# Patient Record
Sex: Female | Born: 1985 | Race: White | Hispanic: No | Marital: Married | Smoking: Never smoker
Health system: Southern US, Community
[De-identification: ages and names within clinical notes are randomized; demographics above are authoritative.]

## PROBLEM LIST (undated history)

## (undated) DIAGNOSIS — M329 Systemic lupus erythematosus, unspecified: Secondary | ICD-10-CM

## (undated) DIAGNOSIS — I82409 Acute embolism and thrombosis of unspecified deep veins of unspecified lower extremity: Secondary | ICD-10-CM

## (undated) DIAGNOSIS — D689 Coagulation defect, unspecified: Secondary | ICD-10-CM

## (undated) HISTORY — PX: TONSILLECTOMY: SUR1361

---

## 2010-03-19 DIAGNOSIS — I82409 Acute embolism and thrombosis of unspecified deep veins of unspecified lower extremity: Secondary | ICD-10-CM | POA: Insufficient documentation

## 2015-05-02 DIAGNOSIS — D6861 Antiphospholipid syndrome: Secondary | ICD-10-CM | POA: Insufficient documentation

## 2015-05-02 DIAGNOSIS — Z86718 Personal history of other venous thrombosis and embolism: Secondary | ICD-10-CM | POA: Insufficient documentation

## 2015-05-02 DIAGNOSIS — F32A Depression, unspecified: Secondary | ICD-10-CM | POA: Insufficient documentation

## 2015-05-02 DIAGNOSIS — O99119 Other diseases of the blood and blood-forming organs and certain disorders involving the immune mechanism complicating pregnancy, unspecified trimester: Secondary | ICD-10-CM | POA: Insufficient documentation

## 2015-05-02 DIAGNOSIS — O99210 Obesity complicating pregnancy, unspecified trimester: Secondary | ICD-10-CM | POA: Insufficient documentation

## 2015-05-02 DIAGNOSIS — F329 Major depressive disorder, single episode, unspecified: Secondary | ICD-10-CM | POA: Insufficient documentation

## 2015-05-30 DIAGNOSIS — O99891 Other specified diseases and conditions complicating pregnancy: Secondary | ICD-10-CM | POA: Insufficient documentation

## 2015-05-30 DIAGNOSIS — R8271 Bacteriuria: Secondary | ICD-10-CM | POA: Insufficient documentation

## 2015-05-31 DIAGNOSIS — K219 Gastro-esophageal reflux disease without esophagitis: Secondary | ICD-10-CM | POA: Insufficient documentation

## 2015-05-31 DIAGNOSIS — Z8639 Personal history of other endocrine, nutritional and metabolic disease: Secondary | ICD-10-CM | POA: Insufficient documentation

## 2015-08-23 DIAGNOSIS — R200 Anesthesia of skin: Secondary | ICD-10-CM | POA: Insufficient documentation

## 2015-10-20 DIAGNOSIS — O3663X0 Maternal care for excessive fetal growth, third trimester, not applicable or unspecified: Secondary | ICD-10-CM | POA: Insufficient documentation

## 2015-10-20 DIAGNOSIS — O409XX0 Polyhydramnios, unspecified trimester, not applicable or unspecified: Secondary | ICD-10-CM | POA: Insufficient documentation

## 2015-11-18 DIAGNOSIS — Z3009 Encounter for other general counseling and advice on contraception: Secondary | ICD-10-CM | POA: Insufficient documentation

## 2016-01-19 DIAGNOSIS — G5603 Carpal tunnel syndrome, bilateral upper limbs: Secondary | ICD-10-CM | POA: Insufficient documentation

## 2016-03-29 DIAGNOSIS — E669 Obesity, unspecified: Secondary | ICD-10-CM | POA: Insufficient documentation

## 2017-08-16 DIAGNOSIS — E0590 Thyrotoxicosis, unspecified without thyrotoxic crisis or storm: Secondary | ICD-10-CM | POA: Insufficient documentation

## 2017-08-16 DIAGNOSIS — E042 Nontoxic multinodular goiter: Secondary | ICD-10-CM | POA: Insufficient documentation

## 2018-01-22 ENCOUNTER — Emergency Department: Payer: Managed Care, Other (non HMO)

## 2018-01-22 ENCOUNTER — Encounter: Payer: Self-pay | Admitting: Emergency Medicine

## 2018-01-22 ENCOUNTER — Emergency Department
Admission: EM | Admit: 2018-01-22 | Discharge: 2018-01-22 | Disposition: A | Discharge status: Home / Self Care | Payer: Managed Care, Other (non HMO) | Attending: Emergency Medicine | Admitting: Emergency Medicine

## 2018-01-22 ENCOUNTER — Other Ambulatory Visit: Payer: Self-pay

## 2018-01-22 DIAGNOSIS — O468X1 Other antepartum hemorrhage, first trimester: Secondary | ICD-10-CM

## 2018-01-22 DIAGNOSIS — O208 Other hemorrhage in early pregnancy: Secondary | ICD-10-CM | POA: Diagnosis present

## 2018-01-22 DIAGNOSIS — O209 Hemorrhage in early pregnancy, unspecified: Secondary | ICD-10-CM

## 2018-01-22 DIAGNOSIS — Z3A12 12 weeks gestation of pregnancy: Secondary | ICD-10-CM | POA: Diagnosis not present

## 2018-01-22 DIAGNOSIS — O418X10 Other specified disorders of amniotic fluid and membranes, first trimester, not applicable or unspecified: Secondary | ICD-10-CM

## 2018-01-22 HISTORY — DX: Systemic lupus erythematosus, unspecified: M32.9

## 2018-01-22 HISTORY — DX: Coagulation defect, unspecified: D68.9

## 2018-01-22 LAB — COMPREHENSIVE METABOLIC PANEL
ALK PHOS: 39 U/L (ref 38–126)
ALT: 12 U/L (ref 0–44)
ANION GAP: 6 (ref 5–15)
AST: 15 U/L (ref 15–41)
Albumin: 3.3 g/dL — ABNORMAL LOW (ref 3.5–5.0)
BUN: 9 mg/dL (ref 6–20)
CALCIUM: 8.6 mg/dL — AB (ref 8.9–10.3)
CO2: 24 mmol/L (ref 22–32)
CREATININE: 0.58 mg/dL (ref 0.44–1.00)
Chloride: 107 mmol/L (ref 98–111)
GFR calc Af Amer: >60 mL/min (ref >60)
Glucose, Bld: 103 mg/dL — ABNORMAL HIGH (ref 70–99)
Potassium: 3.9 mmol/L (ref 3.5–5.1)
Sodium: 137 mmol/L (ref 135–145)
TOTAL PROTEIN: 7.1 g/dL (ref 6.5–8.1)
Total Bilirubin: 0.3 mg/dL (ref 0.3–1.2)

## 2018-01-22 LAB — CBC
HEMATOCRIT: 38.6 % (ref 36.0–46.0)
Hemoglobin: 12.7 g/dL (ref 12.0–15.0)
MCH: 28 pg (ref 26.0–34.0)
MCHC: 32.9 g/dL (ref 30.0–36.0)
MCV: 85.2 fL (ref 80.0–100.0)
NRBC: 0 % (ref 0.0–0.2)
PLATELETS: 220 10*3/uL (ref 150–400)
RBC: 4.53 MIL/uL (ref 3.87–5.11)
RDW: 13.2 % (ref 11.5–15.5)
WBC: 7.7 10*3/uL (ref 4.0–10.5)

## 2018-01-22 LAB — POCT PREGNANCY, URINE: PREG TEST UR: POSITIVE — AB

## 2018-01-22 LAB — HCG, QUANTITATIVE, PREGNANCY: hCG, Beta Chain, Quant, S: 106816 m[IU]/mL — ABNORMAL HIGH (ref <5)

## 2018-01-22 LAB — ABO/RH: ABO/RH(D): O POS

## 2018-01-22 NOTE — ED Notes (Signed)
Pt went to ultrasound.

## 2018-01-22 NOTE — ED Triage Notes (Signed)
Pt arrived to the ED accompanied by her coworker for complaints of vaginal bleeding. Pt reports that she is pregnant. Moderate bleeding noted. Dr. Manson Passey is at bedside.

## 2018-01-22 NOTE — ED Notes (Signed)
Pt returned from ultrasound

## 2018-01-22 NOTE — ED Notes (Signed)
Patient with new bright red blood upon standing up to change. Dr. Manson Passey at bedside for reevaluation. Per MD, patient ok to be discharged and follow up with OB.

## 2018-01-22 NOTE — ED Provider Notes (Signed)
[facility] Emergency Department Provider Note _______________________   First MD Initiated Contact with Patient 01/22/18 [phone]     (approximate)  I have reviewed the triage vital signs and the nursing notes.   HISTORY  Chief Complaint Vaginal Bleeding and Threatened Miscarriage   HPI Nicole Black is a 32 y.o. female G3, P2 currently [redacted] weeks pregnant with below list of chronic medical conditions presents to the emergency department with acute onset of vaginal bleeding this morning while at work.  Patient states that she noted light pink blood on her thighs and when she urinated.  Patient denies any abdominal pelvic discomfort.  Patient states that her blood type is O+   Past Medical History:  Diagnosis Date  . Blood clotting tendency (HCC)   . Lupus (HCC)     There are no active problems to display for this patient.   Past Surgical History:  Procedure Laterality Date  . TONSILLECTOMY      Prior to Admission medications   Not on File    Allergies No known drug allergies History reviewed. No pertinent family history.  Social History Social History   Tobacco Use  . Smoking status: Never Smoker  . Smokeless tobacco: Never Used  Substance Use Topics  . Alcohol use: Not Currently  . Drug use: Never    Review of Systems Constitutional: No fever/chills Eyes: No visual changes. ENT: No sore throat. Cardiovascular: Denies chest pain. Respiratory: Denies shortness of breath. Gastrointestinal: No abdominal pain.  No nausea, no vomiting.  No diarrhea.  No constipation. Genitourinary: Negative for dysuria.  Positive for vaginal bleeding musculoskeletal: Negative for neck pain.  Negative for back pain. Integumentary: Negative for rash. Neurological: Negative for headaches, focal weakness or numbness.  ____________________________________________   PHYSICAL EXAM:  VITAL SIGNS: ED Triage Vitals  Enc Vitals Group     BP 01/22/18 0420  127/65     Pulse Rate 01/22/18 0420 89     Resp 01/22/18 0420 18     Temp 01/22/18 0420 98 F (36.7 C)     Temp Source 01/22/18 0420 Oral     SpO2 01/22/18 0420 100 %     Weight 01/22/18 0421 132.8 kg (292 lb 12.3 oz)     Height 01/22/18 0421 1.803 m (5\' 11" )     Head Circumference --      Peak Flow --      Pain Score 01/22/18 0420 0     Pain Loc --      Pain Edu? --      Excl. in GC? --     Constitutional: Alert and oriented. Well appearing and in no acute distress. Eyes: Conjunctivae are normal.  Head: Atraumatic. Mouth/Throat: Mucous membranes are moist.  Oropharynx non-erythematous. Neck: No stridor.   Cardiovascular: Normal rate, regular rhythm. Good peripheral circulation. Grossly normal heart sounds. Respiratory: Normal respiratory effort.  No retractions. Lungs CTAB. Gastrointestinal: Soft and nontender. No distention.  Genitourinary: No active vaginal bleeding cervix closed Musculoskeletal: No lower extremity tenderness nor edema. No gross deformities of extremities. Neurologic:  Normal speech and language. No gross focal neurologic deficits are appreciated.  Skin:  Skin is warm, dry and intact. No rash noted. Psychiatric: Mood and affect are normal. Speech and behavior are normal.  ____________________________________________   LABS (all labs ordered are listed, but only abnormal results are displayed)  Labs Reviewed  POCT PREGNANCY, URINE - Abnormal; Notable for the following components:      Result Value  Preg Test, Ur POSITIVE (*)    All other components within normal limits  CBC  COMPREHENSIVE METABOLIC PANEL  HCG, QUANTITATIVE, PREGNANCY  ABO/RH   __________________________________________  RADIOLOGY I, Howells N Alwilda Gilland, personally viewed and evaluated these images (plain radiographs) as part of my medical decision making, as well as reviewing the written report by the radiologist.  ED MD interpretation: Single live intrauterine pregnancy 12 weeks 4  days with heart rate of 150 bpm small anterior subchorionic hemorrhage present radiologist.  Official radiology report(s): US Ob Comp Less 14 Wks  Result Date: 01/22/2018 CLINICAL DATA:  Vaginal bleeding for 2 hours. Positive urine pregnancy test. Estimated gestational age by LMP is 14 weeks 4 days. EXAM: OBSTETRIC <14 WK ULTRASOUND TECHNIQUE: Transabdominal ultrasound was performed for evaluation of the gestation as well as the maternal uterus and adnexal regions. COMPARISON:  None. FINDINGS: Intrauterine gestational sac: A single intrauterine pregnancy is present. Yolk sac: Yolk sac is not identified consistent with gestational age. Embryo:  Fetal pole is present. Cardiac Activity: Fetal cardiac activity is observed. Heart Rate: 150 bpm CRL: 60.9 mm   12 w 4 d                  [place] EDC: 08/02/2018 Subchorionic hemorrhage: Small anterior subchorionic hemorrhage. Measures 1.8 x 0.7 x 4.2 cm. Maternal uterus/adnexae: Uterus is anteverted. No myometrial mass lesions identified. Both ovaries are visualized and appear normal. No abnormal pelvic fluid. IMPRESSION: Single intrauterine pregnancy. Estimated gestational age by crown-rump length is 12 weeks 4 days. Small subchorionic hemorrhage is present. Electronically Signed   By: Burman Nieves M.D.   On: 01/22/2018 06:10      Procedures   ____________________________________________   INITIAL IMPRESSION / ASSESSMENT AND PLAN / ED COURSE  As part of my medical decision making, I reviewed the following data within the electronic MEDICAL RECORD NUMBER   32 year old female presented with above-stated history and physical exam of acute onset of vaginal bleeding during pregnancy.  Ultrasound revealed evidence of a small subchorionic hemorrhage.  No active vaginal bleeding on pelvic exam.  Patient has an appointment today with OB/GYN high risk at [facility].  I encouraged the patient to keep that appointment.  I advised the patient to remain on pelvic  rest. ____________________________________________  FINAL CLINICAL IMPRESSION(S) / ED DIAGNOSES  Final diagnoses:  Subchorionic hemorrhage of placenta in first trimester, single or unspecified fetus  Vaginal bleeding in pregnancy, first trimester     MEDICATIONS GIVEN DURING THIS VISIT:  Medications - No data to display   ED Discharge Orders    None       Note:  This document was prepared using Dragon voice recognition software and may include unintentional dictation errors.    Darci Current, MD 01/22/18 [phone]

## 2018-10-22 ENCOUNTER — Ambulatory Visit: Payer: Managed Care, Other (non HMO) | Admitting: Adult Health

## 2018-10-22 ENCOUNTER — Other Ambulatory Visit: Payer: Self-pay

## 2018-10-22 ENCOUNTER — Encounter: Payer: Self-pay | Admitting: Adult Health

## 2018-10-22 DIAGNOSIS — R6889 Other general symptoms and signs: Secondary | ICD-10-CM

## 2018-10-22 DIAGNOSIS — R0981 Nasal congestion: Secondary | ICD-10-CM

## 2018-10-22 DIAGNOSIS — Z20822 Contact with and (suspected) exposure to covid-19: Secondary | ICD-10-CM

## 2018-10-22 DIAGNOSIS — Z86718 Personal history of other venous thrombosis and embolism: Secondary | ICD-10-CM | POA: Insufficient documentation

## 2018-10-22 DIAGNOSIS — J3489 Other specified disorders of nose and nasal sinuses: Secondary | ICD-10-CM

## 2018-10-22 DIAGNOSIS — Z20828 Contact with and (suspected) exposure to other viral communicable diseases: Secondary | ICD-10-CM

## 2018-10-22 DIAGNOSIS — IMO0002 Reserved for concepts with insufficient information to code with codable children: Secondary | ICD-10-CM | POA: Insufficient documentation

## 2018-10-22 DIAGNOSIS — Z8759 Personal history of other complications of pregnancy, childbirth and the puerperium: Secondary | ICD-10-CM | POA: Insufficient documentation

## 2018-10-22 DIAGNOSIS — M329 Systemic lupus erythematosus, unspecified: Secondary | ICD-10-CM | POA: Insufficient documentation

## 2018-10-22 MED ORDER — SALINE SPRAY 0.65 % NA SOLN: 1.0000 | NASAL | 0 refills | Status: AC | PRN

## 2018-10-22 MED ORDER — AMOXICILLIN 875 MG PO TABS: 875.0000 mg | ORAL_TABLET | Freq: Two times a day (BID) | ORAL | 0 refills | Status: AC

## 2018-10-22 MED ORDER — FLUTICASONE PROPIONATE 50 MCG/ACT NA SUSP: 2.0000 | Freq: Every day | NASAL | 0 refills | Status: AC

## 2018-10-22 NOTE — Patient Instructions (Signed)
Follow up with primary care if nasal congestion worsens or does not clear 100 % with treatment.  Hello Nicole Black,  You are being placed in the home monitoring program for COVID-19 (commonly known as Coronavirus).  This is because you are suspected to have the virus or are known to have the virus.  If you are unsure which group you fall into call your clinic.    As part of this program, you'll answer a daily questionnaire in the MyChart mobile app. You'll receive a notification through the MyChart app when the questionnaire is available. When you log in to MyChart, you'll see the tasks in your To Do activity.       Clinicians will see any answers that are concerning and take appropriate steps.  If at any point you are having a medical emergency, call 911.  If otherwise concerned call your clinic instead of coming into the clinic or hospital.  To keep from spreading the disease you should: Stay home and limit contact with other people as much as possible.  Wash your hands frequently. Cover your coughs and sneezes with a tissue, and throw used tissues in the trash.   Clean and disinfect frequently touched surfaces and objects.    Take care of yourself by: Staying home Resting Drinking fluids Take fever-reducing medications (Tylenol/Acetaminophen and Ibuprofen)  For more information on the disease go to the Centers for Disease Control and Prevention website   COVID-19: How to Protect Yourself and Others Know how it spreads  There is currently no vaccine to prevent coronavirus disease 2019 (COVID-19).  The best way to prevent illness is to avoid being exposed to this virus.  The virus is thought to spread mainly from person-to-person. ? Between people who are in close contact with one another (within about 6 feet). ? Through respiratory droplets produced when an infected person coughs, sneezes or talks. ? These droplets can land in the mouths or noses of people who are nearby or possibly  be inhaled into the lungs. ? Some recent studies have suggested that COVID-19 may be spread by people who are not showing symptoms. Everyone should Clean your hands often  Wash your hands often with soap and water for at least 20 seconds especially after you have been in a public place, or after blowing your nose, coughing, or sneezing.  If soap and water are not readily available, use a hand sanitizer that contains at least 60% alcohol. Cover all surfaces of your hands and rub them together until they feel dry.  Avoid touching your eyes, nose, and mouth with unwashed hands. Avoid close contact  Stay home if you are sick.  Avoid close contact with people who are sick.  Put distance between yourself and other people. ? Remember that some people without symptoms may be able to spread virus. ? This is especially important for people who are at higher risk of getting very [employer].[place].www.cdc.gov/coronavirus/2019-ncov/need-extra-precautions/people-at-higher-risk.html Cover your mouth and nose with a cloth face cover when around others  You could spread COVID-19 to others even if you do not feel sick.  Everyone should wear a cloth face cover when they have to go out in public, for example to the grocery store or to pick up other necessities. ? Cloth face coverings should not be placed on young children under age 10, anyone who has trouble breathing, or is unconscious, incapacitated or otherwise unable to remove the mask without assistance.  The cloth face cover is meant to protect other people  in case you are infected.  Do NOT use a facemask meant for a Research scientist (physical sciences)healthcare worker.  Continue to keep about 6 feet between yourself and others. The cloth face cover is not a substitute for social distancing. Cover coughs and sneezes  If you are in a private setting and do not have on your cloth face covering, remember to always cover your mouth and nose with a tissue when you cough or sneeze or use the inside of  your elbow.  Throw used tissues in the trash.  Immediately wash your hands with soap and water for at least 20 seconds. If soap and water are not readily available, clean your hands with a hand sanitizer that contains at least 60% alcohol. Clean and disinfect  Clean AND disinfect frequently touched surfaces daily. This includes tables, doorknobs, light switches, countertops, handles, desks, phones, keyboards, toilets, faucets, and sinks. ktimeonline.comwww.cdc.gov/coronavirus/2019-ncov/prevent-getting-sick/disinfecting-your-home.html  If surfaces are dirty, clean them: Use detergent or soap and water prior to disinfection.  Then, use a household disinfectant. You can see a list of EPA-registered household disinfectants here. [place].co.ukcdc.gov/coronavirus 07/22/2018 This information is not intended to replace advice given to you by your health care provider. Make sure you discuss any questions you have with your health care provider. Document Released: 07/01/2018 Document Revised: 07/30/2018 Document Reviewed: 07/01/2018 Elsevier Patient Education  2020 Elsevier Inc. Upper Respiratory Infection, Adult An upper respiratory infection (URI) affects the nose, throat, and upper air passages. URIs are caused by germs (viruses). The most common type of URI is often called "the common cold." Medicines cannot cure URIs, but you can do things at home to relieve your symptoms. URIs usually get better within 7-10 days. Follow these instructions at home: Activity  Rest as needed.  If you have a fever, stay home from work or school until your fever is gone, or until your doctor says you may return to work or school. ? You should stay home until you cannot spread the infection anymore (you are not contagious). ? Your doctor may have you wear a face mask so you have less risk of spreading the infection. Relieving symptoms  Gargle with a salt-water mixture 3-4 times a day or as needed. To make a salt-water mixture, completely  dissolve -1 tsp of salt in 1 cup of warm water.  Use a cool-mist humidifier to add moisture to the air. This can help you breathe more easily. Eating and drinking   Drink enough fluid to keep your pee (urine) pale yellow.  Eat soups and other clear broths. General instructions   Take over-the-counter and prescription medicines only as told by your doctor. These include cold medicines, fever reducers, and cough suppressants.  Do not use any products that contain nicotine or tobacco. These include cigarettes and e-cigarettes. If you need help quitting, ask your doctor.  Avoid being where people are smoking (avoid secondhand smoke).  Make sure you get regular shots and get the flu shot every year.  Keep all follow-up visits as told by your doctor. This is important. How to avoid spreading infection to others   Wash your hands often with soap and water. If you do not have soap and water, use hand sanitizer.  Avoid touching your mouth, face, eyes, or nose.  Cough or sneeze into a tissue or your sleeve or elbow. Do not cough or sneeze into your hand or into the air. Contact a doctor if:  You are getting worse, not better.  You have any of these: ? A  fever. ? Chills. ? Brown or red mucus in your nose. ? Yellow or brown fluid (discharge)coming from your nose. ? Pain in your face, especially when you bend forward. ? Swollen neck glands. ? Pain with swallowing. ? White areas in the back of your throat. Get help right away if:  You have shortness of breath that gets worse.  You have very bad or constant: ? Headache. ? Ear pain. ? Pain in your forehead, behind your eyes, and over your cheekbones (sinus pain). ? Chest pain.  You have long-lasting (chronic) lung disease along with any of these: ? Wheezing. ? Long-lasting cough. ? Coughing up blood. ? A change in your usual mucus.  You have a stiff neck.  You have changes in  your: ? Vision. ? Hearing. ? Thinking. ? Mood. Summary  An upper respiratory infection (URI) is caused by a germ called a virus. The most common type of URI is often called "the common cold."  URIs usually get better within 7-10 days.  Take over-the-counter and prescription medicines only as told by your doctor. This information is not intended to replace advice given to you by your health care provider. Make sure you discuss any questions you have with your health care provider. Document Released: 08/22/2007 Document Revised: 03/13/2018 Document Reviewed: 10/26/2016 Elsevier Patient Education  2020 [employer].

## 2018-10-22 NOTE — Progress Notes (Addendum)
Patient ID: Nicole Black, female   DOB: 02/26/1986, 33 y.o.   MRN: [id number] [facility]   Virtual Visit via Telephone Note  I connected with Nicole MazeAshlyn Swartz on 10/22/18 at  2:00 PM EDT by telephone and verified that I am speaking with the correct person using two identifiers.  Location: Patient: at home  Provider: [facility], [place] [facility], [place]     I discussed the limitations, risks, security and privacy concerns of performing an evaluation and management service by telephone and the availability of in person appointments. I also discussed with the patient that there may be a patient responsible charge related to this service. The patient expressed understanding and agreed to proceed.  History of Present Illness: Patient is a 33 year old female in no acute distress who calls for a telephone visit due to Covid 19 pandemic. Onset of symptoms 10/17/18.  She was exposed to Covid on July 22nd and August 3rd while at work with [employer] [place] [employer]. Both exposures were close contact. She is coughing occasionally only with facial mask.  She denies any other coughing. She has nasal drainage that was clear and now has turned yellow.   History of DVT with pregnancy 2019, and has history of lupus. She is on Lasix, and ASA 81 mg. Denies any edema.   Xyzal she takes as needed. She has taken benadryl at night.  No nasal sprays. She has used Afrin x 3 days.  Nasal pressure and frontal pressure.    Patient  denies any fever, body aches,chills, rash, chest pain, shortness of breath, nausea, vomiting, or diarrhea.   She has young children. Her children are home. She recently had sick child with same type symptoms.   Patient's last menstrual period was 10/15/2018 (approximate). Not lactating.   No Known Allergies  She has no fever she reports and has " felt no need to take her pulse oximeter " she states. She has primary  care provider she sees regularly she reports.  Observations/Objective: 98.4 oral temperature.    Patient is alert and oriented and responsive to questions Engages in conversation with provider. Speaks in full sentences without any pauses without any shortness of breath or distress.   Speaks 20 plus words without pause while on phone with provider.   Assessment and Plan:  1. Nasal sinus congestion   2. Sinus pressure   3. Exposure to Covid-19 Virus   4. Suspected Covid-19 Virus Infection     Follow Up Instructions: Meds ordered this encounter  Medications  . amoxicillin (AMOXIL) 875 MG tablet    Sig: Take 1 tablet (875 mg total) by mouth 2 (two) times daily.    Dispense:  20 tablet    Refill:  0  . fluticasone (FLONASE) 50 MCG/ACT nasal spray    Sig: Place 2 sprays into both nostrils daily.    Dispense:  16 g    Refill:  0  . sodium chloride (OCEAN) 0.65 % SOLN nasal spray    Sig: Place 1 spray into both nostrils as needed for congestion.    Dispense:  37.5 mL    Refill:  0  Start your Xyzal daily.  Discontinue Afrin. Use ocean nasal spray for moisture.  She has already gone for Covid testing today 10/22/18. Quarantine until August 12 th Wednesday per health care provider guidelines CDC must meet requirements on note sent to patient in [facility]MY CHART.   Advised recommend Augmentin for sinus  infection-patient reports it gives her diarrhea she prefers amoxicillin.  Discussed will give however with her history of sinusitis in past - bacteria may be resistant to Amoxicillin and to call back should any symptoms worsen.  I discussed the assessment and treatment plan with the patient. The patient was provided an opportunity to ask questions and all were answered. The patient agreed with the plan and demonstrated an understanding of the instructions.   The patient was advised to call back or seek an in-person evaluation if the symptoms worsen or if the condition fails to improve as  anticipated.  I provided 15 minutes of non-face-to-face time during this encounter.   Marcille Buffy, FNP

## 2018-10-23 LAB — NOVEL CORONAVIRUS, NAA: SARS-CoV-2, NAA: NOT DETECTED

## 2018-10-27 ENCOUNTER — Telehealth: Payer: Self-pay

## 2018-10-27 NOTE — Telephone Encounter (Signed)
The patient called and staated that she feels that her congestion has gone into her chest now since her visit. She was advised after conferring with the Provider Laverna Peace FNP-C  that with her symptoms changing that she would need an in person evaluation, that another virtual consult may not be appropriate care and was advised to got [facility] or the ED. The patient stated that she really did not want to goto the ED due to receiving a large bill but would go as a last resort and also had issues with child care at the moment but gave verbal understanding of our recommendations.

## 2018-10-28 NOTE — Telephone Encounter (Signed)
10/27/18 She was advised emergency room or [facility] and that second virtual visit was not appropriate. Provider was in room with [place] while patient was on the phone.

## 2018-12-07 ENCOUNTER — Other Ambulatory Visit: Payer: Self-pay

## 2018-12-07 ENCOUNTER — Emergency Department: Payer: Managed Care, Other (non HMO)

## 2018-12-07 ENCOUNTER — Emergency Department
Admission: EM | Admit: 2018-12-07 | Discharge: 2018-12-07 | Disposition: A | Discharge status: Home / Self Care | Payer: Managed Care, Other (non HMO) | Attending: Emergency Medicine | Admitting: Emergency Medicine

## 2018-12-07 ENCOUNTER — Encounter: Payer: Self-pay | Admitting: Emergency Medicine

## 2018-12-07 DIAGNOSIS — Z79899 Other long term (current) drug therapy: Secondary | ICD-10-CM | POA: Diagnosis not present

## 2018-12-07 DIAGNOSIS — R2242 Localized swelling, mass and lump, left lower limb: Secondary | ICD-10-CM | POA: Diagnosis present

## 2018-12-07 DIAGNOSIS — M79662 Pain in left lower leg: Secondary | ICD-10-CM | POA: Diagnosis not present

## 2018-12-07 DIAGNOSIS — Z7982 Long term (current) use of aspirin: Secondary | ICD-10-CM | POA: Diagnosis not present

## 2018-12-07 HISTORY — DX: Acute embolism and thrombosis of unspecified deep veins of unspecified lower extremity: I82.409

## 2018-12-07 MED ORDER — ENOXAPARIN SODIUM 100 MG/ML ~~LOC~~ SOLN: 200.0000 mg | Freq: Every day | SUBCUTANEOUS | 0 refills | Status: AC

## 2018-12-07 MED ORDER — ENOXAPARIN SODIUM 100 MG/ML ~~LOC~~ SOLN
200.0000 mg | Freq: Once | SUBCUTANEOUS | Status: AC
  Administered 2018-12-07: 200 mg via SUBCUTANEOUS
  Filled 2018-12-07: qty 2

## 2018-12-07 NOTE — ED Triage Notes (Signed)
Pt to ED via POV c/o swelling in the left lower leg. Pt states that she hurt her knee at work on Wednesday and started having some swelling. Pt states that the swelling has continued to get worse. Pt started having pain in her left calf on Friday. Pt has hx/o DVT in same leg. Pt states that she had 1 Lovenox shot left over from her pregnancy and she took that. Pt also takes Lasix. Pt denies chest pain or ShOB. Pt is tearful and anxious.

## 2018-12-07 NOTE — ED Provider Notes (Signed)
[facility] Emergency Department Provider Note  ____________________________________________  Time seen: Approximately 10:25 AM  I have reviewed the triage vital signs and the nursing notes.   HISTORY  Chief Complaint Leg Swelling    HPI Nicole Black is a 33 y.o. 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  Denies chest pain shortness of breath fevers or chills.  Pain is 2/10, constant, waxing and waning, worse with ambulation.  No significant alleviating factors.  Nonradiating  She notes that a week ago she recently took a car trip to [place] and returned.  4 days ago she sprained her left knee while working.  The pain and swelling primarily are onset and worse over the last 48 hours.  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  She took Eliquis long-term and was discontinued from that in 2016.  She notes a previous diagnosis of hypercoagulable state from antiphospholipid syndrome, but then during her most recent pregnancy, her doctor at [facility] told her that her test for this condition was negative.   Reviewing electronic medical record I see Jul 29, 2015- lupus anticoagulation screen.  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     Past Medical History:  Diagnosis Date  . 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 DVT (deep venous thrombosis) ([place])   . Lupus [facility])      Patient Active Problem List   Diagnosis Date Noted  . History of blood clot in deep vein during pregnancy 10/22/2018  . Lupus ([place]) 10/22/2018  . Multinodular goiter 08/16/2017  . Subclinical hyperthyroidism 08/16/2017  . Obesity 03/29/2016  . Bilateral carpal tunnel syndrome 01/19/2016  . Contraceptive education 11/18/2015  . Fetal macrosomia, third trimester,  not applicable or unspecified fetus 10/20/2015  . Polyhydramnios, antepartum 10/20/2015  . Bilateral numbness and tingling of arms and legs 08/23/2015  . Hx of thyroid nodule 05/31/2015  . Gastroesophageal reflux disease without esophagitis 05/31/2015  . Asymptomatic bacteriuria during pregnancy in second trimester 05/30/2015  . Antiphospholipid antibody syndrome complicating pregnancy ([place]) 05/02/2015  . Depression 05/02/2015  . Obesity complicating pregnancy 24/40/1027  . H/O deep venous thrombosis 05/02/2015  . DVT (deep venous thrombosis) ([place]) 03/19/2010     Past Surgical History:  Procedure Laterality Date  . TONSILLECTOMY       Prior to Admission medications   Medication Sig Start Date End Date Taking? Authorizing Provider  acetaminophen (TYLENOL) 500 MG tablet Take 500-1,000 mg by mouth every 6 (six) hours as needed for mild pain or fever.   Yes [provider]  aspirin 81 MG chewable tablet Chew 81 mg by mouth daily.    Yes [provider]  Biotin w/ Vitamins C & E (HAIR/SKIN/NAILS) 1250-7.5-7.5 MCG-MG-UNT CHEW Chew 1 tablet by mouth daily.   Yes [provider]  esomeprazole (NEXIUM) 20 MG capsule Take 20 mg by mouth daily.  07/14/18  Yes [provider]  furosemide (LASIX) 20 MG tablet Take 20 mg by mouth daily.  10/06/18  Yes [provider]  Prenatal Vit-Fe Fumarate-FA (MULTIVITAMIN-PRENATAL) 27-0.8 MG TABS tablet Take 1 tablet by mouth daily at 12 noon.   Yes [provider]  sodium chloride (OCEAN) 0.65 % SOLN nasal spray Place 1 spray into both nostrils as needed for congestion. 10/22/18  Yes Flinchum, Sharyn Lull  S, FNP  amoxicillin (AMOXIL) 875 MG tablet Take 1 tablet (875 mg total) by mouth 2 (two) times daily. Patient not taking: Reported on 12/07/2018 10/22/18   Flinchum, Eula FriedMichelle S, FNP  enoxaparin (LOVENOX) 100 MG/ML injection Inject 2 mLs (200 mg total) into the skin daily for 14 days. 12/07/18 12/21/18  Sharman CheekStafford,  Roselynn Whitacre, MD  fluticasone [facility](FLONASE) 50 MCG/ACT nasal spray Place 2 sprays into both nostrils daily. Patient not taking: Reported on 12/07/2018 10/22/18   Flinchum, Eula FriedMichelle S, FNP     Allergies Patient has no known allergies.   No family history on file.  Social History Social History   Tobacco Use  . Smoking status: Never Smoker  . Smokeless tobacco: Never Used  Substance Use Topics  . Alcohol use: Not Currently  . Drug use: Never    Review of Systems  Constitutional:   No fever or chills.  ENT:   No sore throat. No rhinorrhea. Cardiovascular:   No chest pain or syncope. Respiratory:   No dyspnea or cough. Gastrointestinal:   Negative for abdominal pain, vomiting and diarrhea.  Musculoskeletal: Left calf pain as above All other systems reviewed and are negative except as documented above in ROS and HPI.  ____________________________________________   PHYSICAL EXAM:  VITAL SIGNS: ED Triage Vitals  Enc Vitals Group     BP 12/07/18 0842 123/81     Pulse Rate 12/07/18 0842 69     Resp 12/07/18 0842 16     Temp 12/07/18 0842 97.7 F (36.5 C)     Temp Source 12/07/18 0842 Oral     SpO2 12/07/18 0842 100 %     Weight --      Height --      Head Circumference --      Peak Flow --      Pain Score 12/07/18 0841 2     Pain Loc --      Pain Edu? --      Excl. in GC? --     Vital signs reviewed, nursing assessments reviewed.   Constitutional:   Alert and oriented. Non-toxic appearance. Eyes:   Conjunctivae are normal. EOMI. PERRL. ENT      Head:   Normocephalic and atraumatic.      Nose:   Wearing a mask.      Mouth/Throat:   Wearing a mask.      Neck:   No meningismus. Full ROM. Hematological/Lymphatic/Immunilogical:   No cervical lymphadenopathy. Cardiovascular:   RRR. Symmetric bilateral radial and DP pulses.  No murmurs. Cap refill less than 2 seconds. Respiratory:   Normal respiratory effort without tachypnea/retractions. Breath sounds are clear and equal  bilaterally. No wheezes/rales/rhonchi.  Musculoskeletal:   Normal range of motion in all extremities. No joint effusions.  Slight medial left calf tenderness and increased calf circumference compared to right leg.  Mild nonpitting edema.  No palpable cord, negative Homans sign.. Neurologic:   Normal speech and language.  Motor grossly intact. No acute focal neurologic deficits are appreciated.  Skin:    Skin is warm, dry and intact. No rash noted.  No petechiae, purpura, or bullae.  ____________________________________________    LABS (pertinent positives/negatives) (all labs ordered are listed, but only abnormal results are displayed) Labs Reviewed - No data to display ____________________________________________   EKG    ____________________________________________    RADIOLOGY  [place] Venous Img Lower Unilateral Left  Result Date: 12/07/2018 CLINICAL DATA:  Swelling, pain EXAM: LEFT LOWER EXTREMITY VENOUS DOPPLER ULTRASOUND TECHNIQUE: Gray-scale sonography  with compression, as well as color and duplex ultrasound, were performed to evaluate the deep venous system from the level of the common femoral vein through the popliteal and proximal calf veins. COMPARISON:  None FINDINGS: Normal compressibility of the common femoral, superficial femoral, and popliteal veins, as well as the proximal calf veins. No filling defects to suggest DVT on grayscale or color Doppler imaging. Doppler waveforms show normal direction of venous flow, normal respiratory phasicity and response to augmentation. Survey views of the contralateral common femoral vein are unremarkable. IMPRESSION: No femoropopliteal and no calf DVT in the visualized calf veins. If clinical symptoms are inconsistent or if there are persistent or worsening symptoms, further imaging (possibly involving the iliac veins) may be warranted. Electronically Signed   By: Corlis Leak M.D.   On: 12/07/2018 09:27     ____________________________________________   PROCEDURES Procedures  ____________________________________________  DIFFERENTIAL DIAGNOSIS   DVT, muscle strain  CLINICAL IMPRESSION / ASSESSMENT AND PLAN / ED COURSE  Medications ordered in the ED: Medications  enoxaparin (LOVENOX) injection 200 mg (has no administration in time range)    Pertinent labs & imaging results that were available during my care of the patient were reviewed by me and considered in my medical decision making (see chart for details).  Nicole Black was evaluated in Emergency Department on 12/07/2018 for the symptoms described in the history of present illness. She was evaluated in the context of the global COVID-19 pandemic, which necessitated consideration that the patient might be at risk for infection with the SARS-CoV-2 virus that causes COVID-19. Institutional protocols and algorithms that pertain to the evaluation of patients at risk for COVID-19 are in a state of rapid change based on information released by regulatory bodies including the CDC and federal and state organizations. These policies and algorithms were followed during the patient's care in the ED.   Patient presents with left calf pain and swelling with strong risk factors for DVT.  She does note that she took a leftover dose of Lovenox last night and had took a full dose of aspirin today.  I will obtain an ultrasound to evaluate for DVT, if positive will resume her on Eliquis.  No evidence of PE/VTE or infectious process.   ----------------------------------------- 12:24 PM on 12/07/2018 -----------------------------------------  Ultrasound unremarkable.  However with her symptoms and risk factors for DVT, I will treat her empirically with Lovenox once daily for the next 7 days, at which time she will follow-up with her doctor or [facility]ology for reassessment of her symptoms to determine if she needs repeat imaging or to continue  anticoagulation or if it is safe to discontinue if her symptoms have resolved.  She is agreeable with this plan.  Stable for discharge home, without any evidence of VTE at this time.     ____________________________________________   FINAL CLINICAL IMPRESSION(S) / ED DIAGNOSES    Final diagnoses:  Pain of left calf     ED Discharge Orders         Ordered    enoxaparin (LOVENOX) 100 MG/ML injection  Daily    Note to Pharmacy: May substitute for other available concentration   12/07/18 1223          Portions of this note were generated with dragon dictation software. Dictation errors may occur despite best attempts at proofreading.   Sharman Cheek, MD 12/07/18 1225

## 2018-12-07 NOTE — Discharge Instructions (Signed)
Take Lovenox 200mg  once daily for the next 7 days and follow up with your doctor for reassessment of your symptoms.      Results for orders placed or performed in visit on 10/22/18  Novel Coronavirus, NAA ([facility])   Specimen: Saline   SALINE  TESTING LOCATI  Result Value Ref Range   SARS-CoV-2, NAA Not Detected Not Detected   US Venous Img Lower Unilateral Left  Result Date: 12/07/2018 CLINICAL DATA:  Swelling, pain EXAM: LEFT LOWER EXTREMITY VENOUS DOPPLER ULTRASOUND TECHNIQUE: Gray-scale sonography with compression, as well as color and duplex ultrasound, were performed to evaluate the deep venous system from the level of the common femoral vein through the popliteal and proximal calf veins. COMPARISON:  None FINDINGS: Normal compressibility of the common femoral, superficial femoral, and popliteal veins, as well as the proximal calf veins. No filling defects to suggest DVT on grayscale or color Doppler imaging. Doppler waveforms show normal direction of venous flow, normal respiratory phasicity and response to augmentation. Survey views of the contralateral common femoral vein are unremarkable. IMPRESSION: No femoropopliteal and no calf DVT in the visualized calf veins. If clinical symptoms are inconsistent or if there are persistent or worsening symptoms, further imaging (possibly involving the iliac veins) may be warranted. Electronically Signed   By: Lucrezia Europe M.D.   On: 12/07/2018 09:27

## 2018-12-07 NOTE — ED Notes (Signed)
Pt gathered all personal belongings from room and removed them prior to ED departure  

## 2019-06-22 IMAGING — US US OB COMP LESS 14 WK
1 series · 14 of 28 positions shown · non-contrast
Comparison: None.

CLINICAL DATA: Vaginal bleeding for 2 hours. Positive urine
pregnancy test. Estimated gestational age by LMP is 14 weeks 4 days.

EXAM:
OBSTETRIC <14 WK ULTRASOUND
TECHNIQUE: Transabdominal ultrasound was performed for evaluation of the
gestation as well as the maternal uterus and adnexal regions.

[Series 1: us ob comp less 14 wk · 0.27mm/px · 14 of 47 slices shown]
[im 2/47]
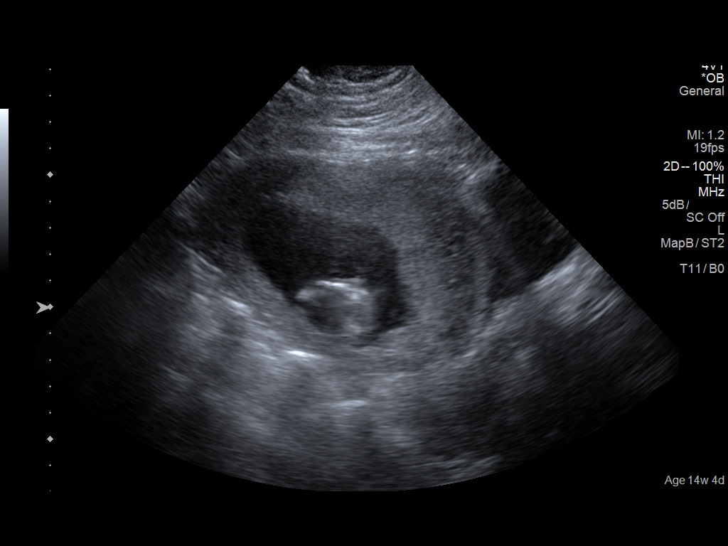
[im 6/47]
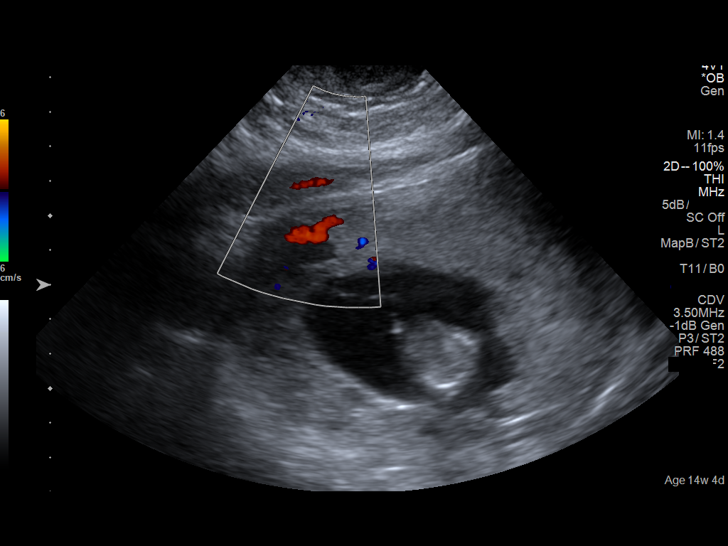
[im 9/47]
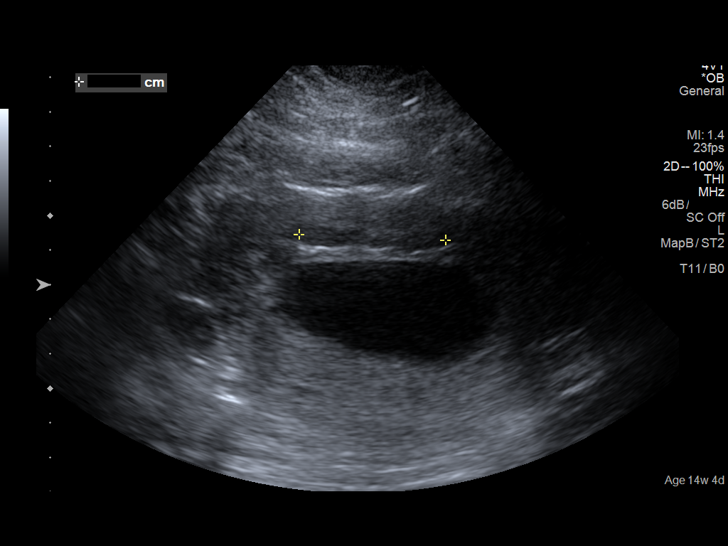
[im 12/47]
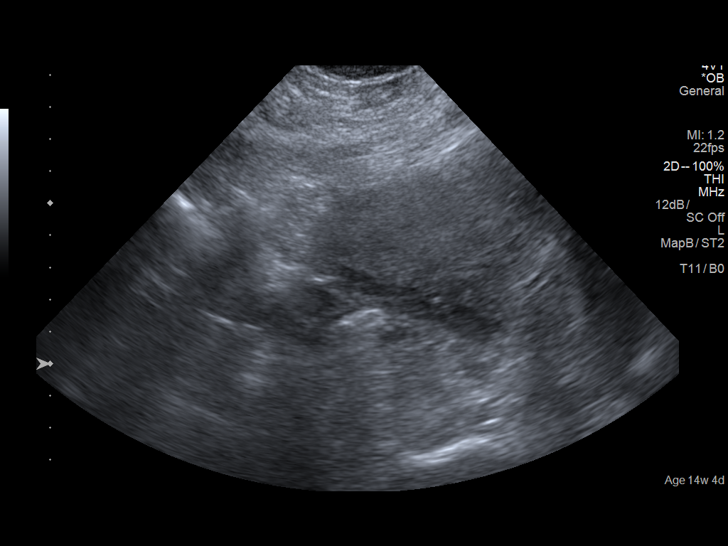
[im 16/47]
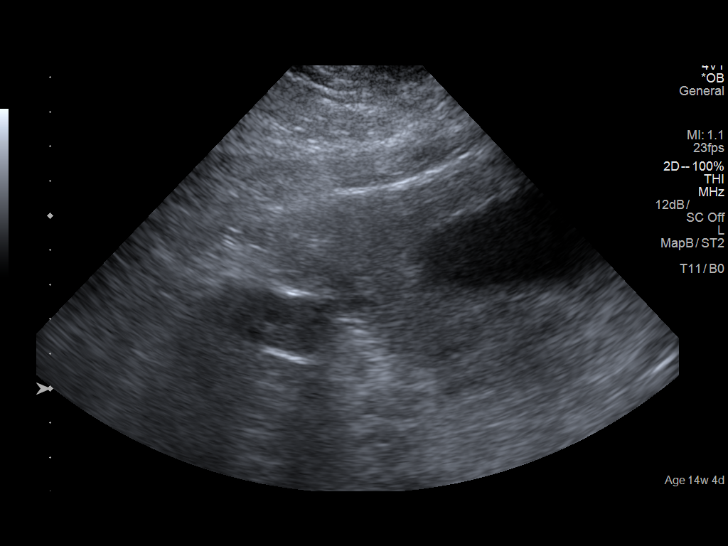
[im 19/47]
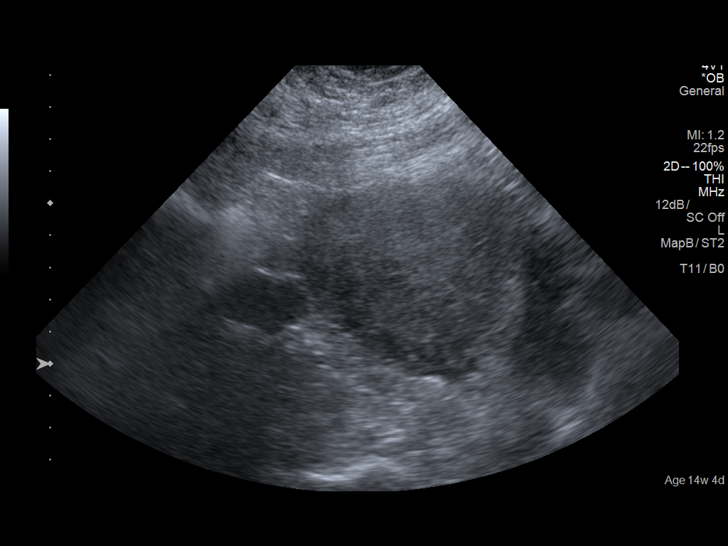
[im 23/47]
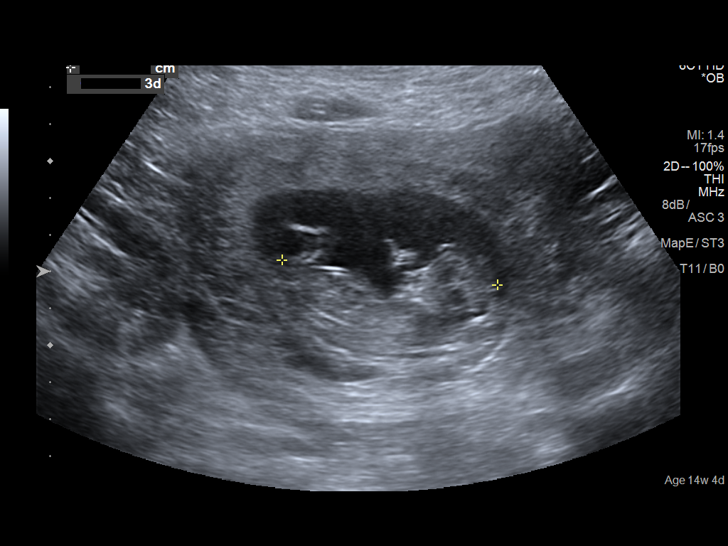
[im 26/47]
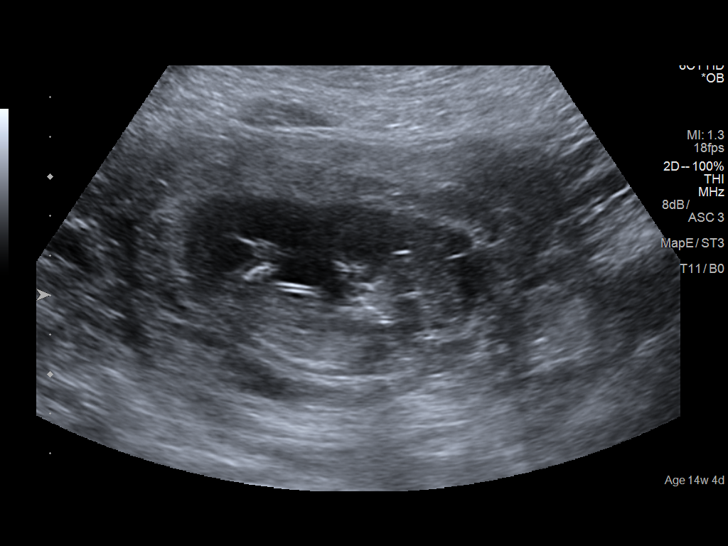
[im 29/47]
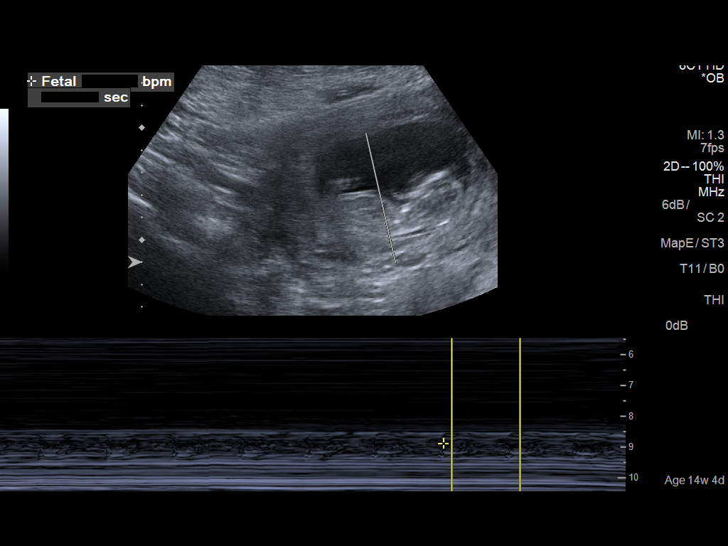
[im 33/47]
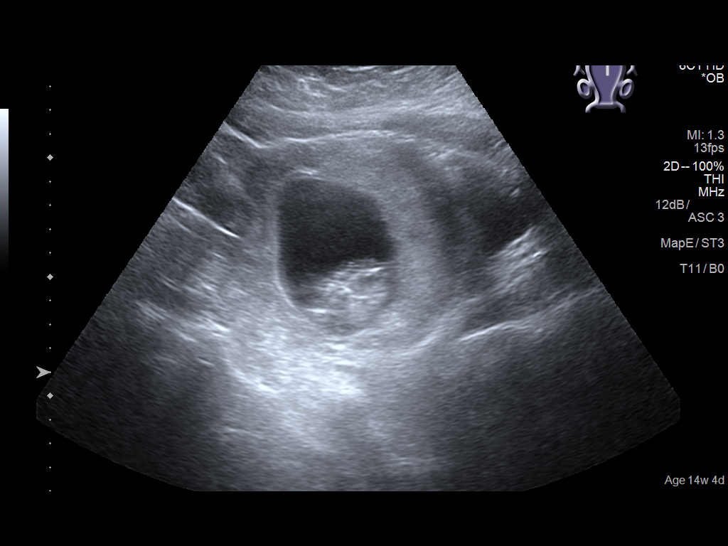
[im 36/47]
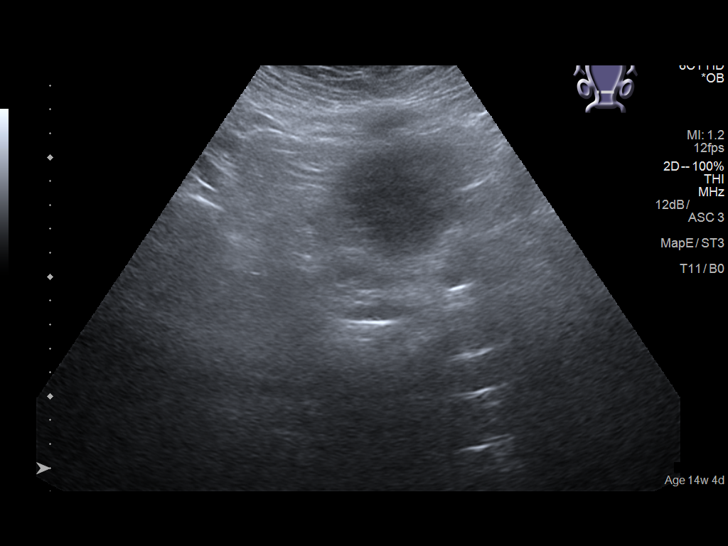
[im 40/47]
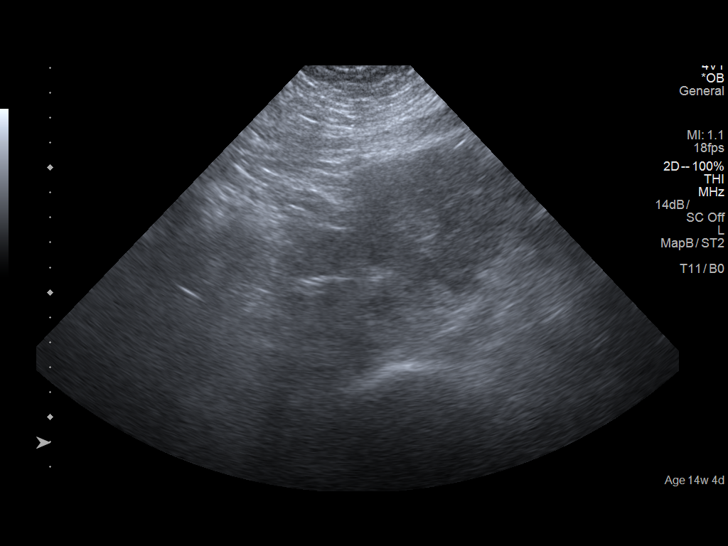
[im 43/47]
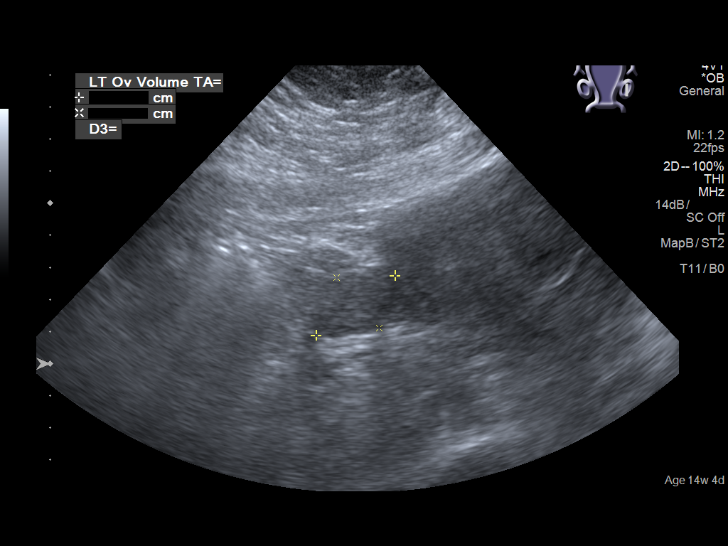
[im 47/47]
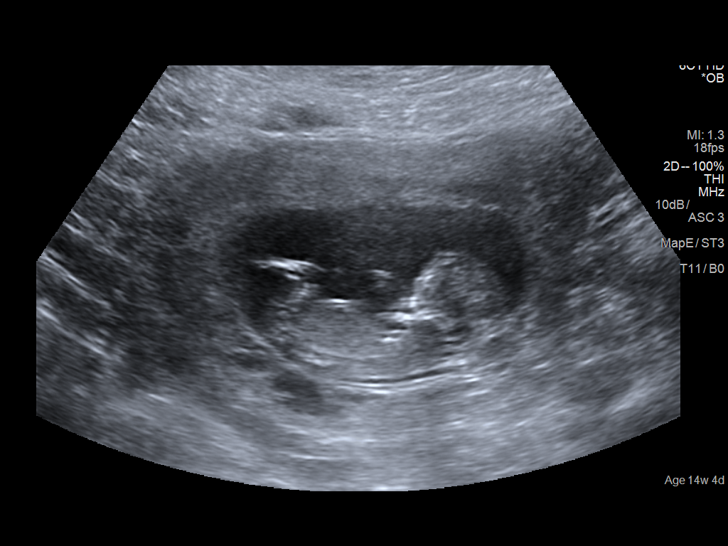

[14 of 28 positions shown; findings below may reference images not displayed]

FINDINGS: Intrauterine gestational sac: A single intrauterine pregnancy is
present.

Yolk sac: Yolk sac is not identified consistent with gestational
age.

Embryo:  Fetal pole is present.

Cardiac Activity: Fetal cardiac activity is observed.

Heart Rate: 150 bpm

CRL: 60.9 mm   12 w 4 d                  US EDC: 08/02/2018

Subchorionic hemorrhage: Small anterior subchorionic hemorrhage.
Measures 1.8 x 0.7 x 4.2 cm.

Maternal uterus/adnexae: Uterus is anteverted. No myometrial mass
lesions identified. Both ovaries are visualized and appear normal.
No abnormal pelvic fluid.
IMPRESSION: Single intrauterine pregnancy. Estimated gestational age by
crown-rump length is 12 weeks 4 days. Small subchorionic hemorrhage
is present.

## 2021-06-06 IMAGING — US US EXTREM LOW VENOUS*L*
1 series · 14 of 24 positions shown · non-contrast
Comparison: None

CLINICAL DATA: Swelling, pain

EXAM:
LEFT LOWER EXTREMITY VENOUS DOPPLER ULTRASOUND
TECHNIQUE: Gray-scale sonography with compression, as well as color and duplex
ultrasound, were performed to evaluate the deep venous system from
the level of the common femoral vein through the popliteal and
proximal calf veins.

[Series 1: us extrem low venous*left* · 14 of 35 slices shown]
[im 1/35]
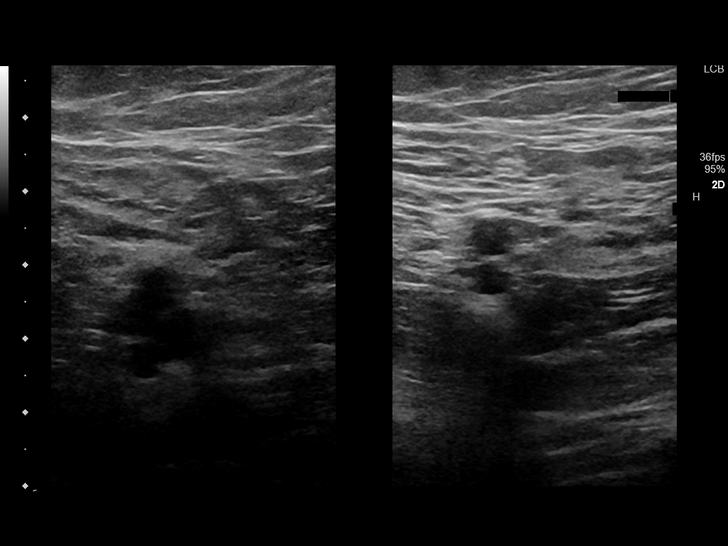
[im 3/35]
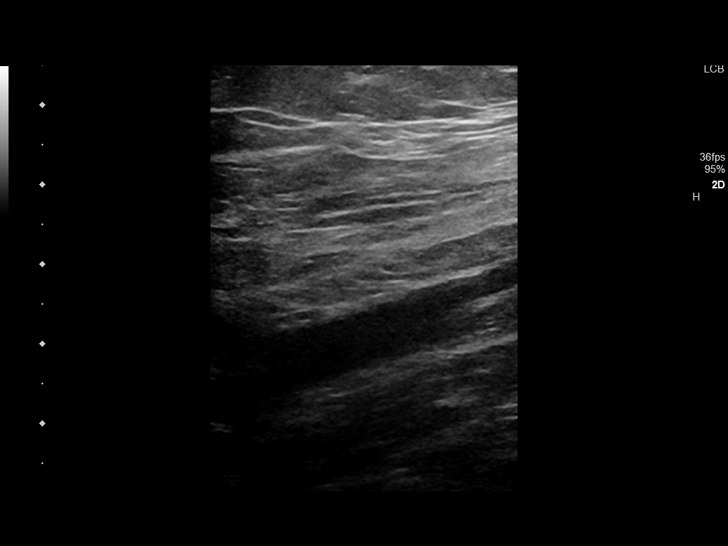
[im 6/35]
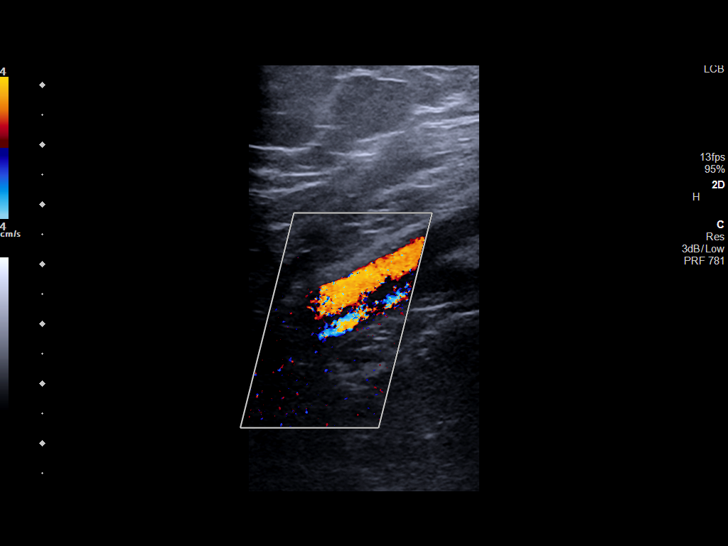
[im 9/35]
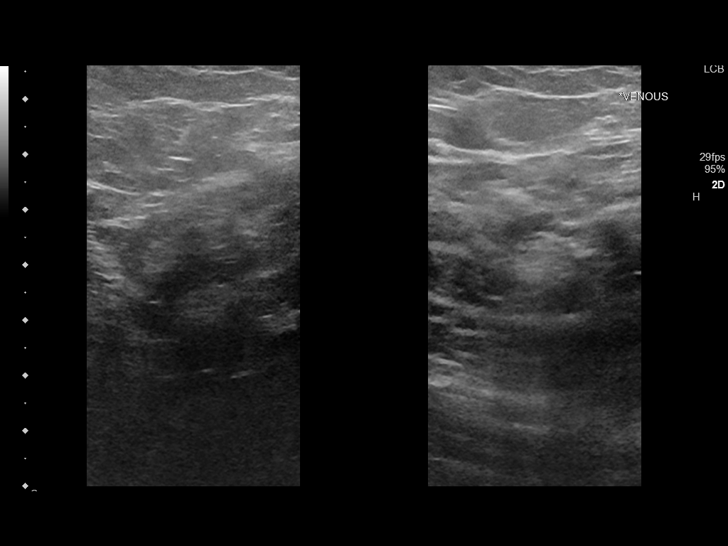
[im 11/35]
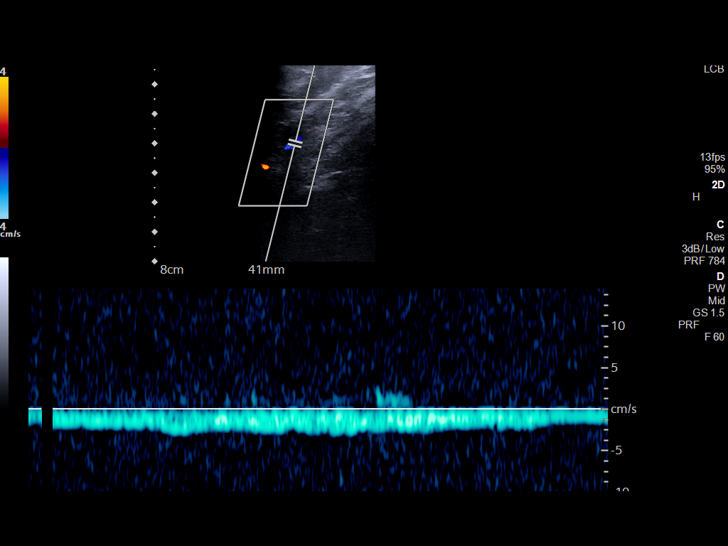
[im 14/35]
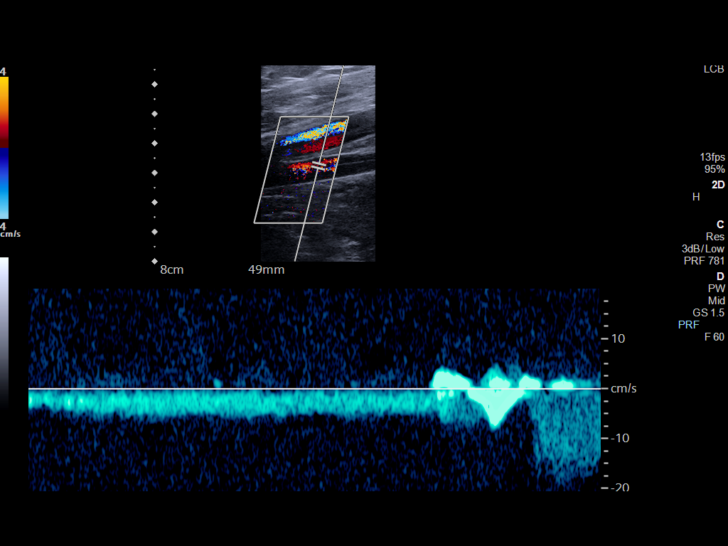
[im 17/35]
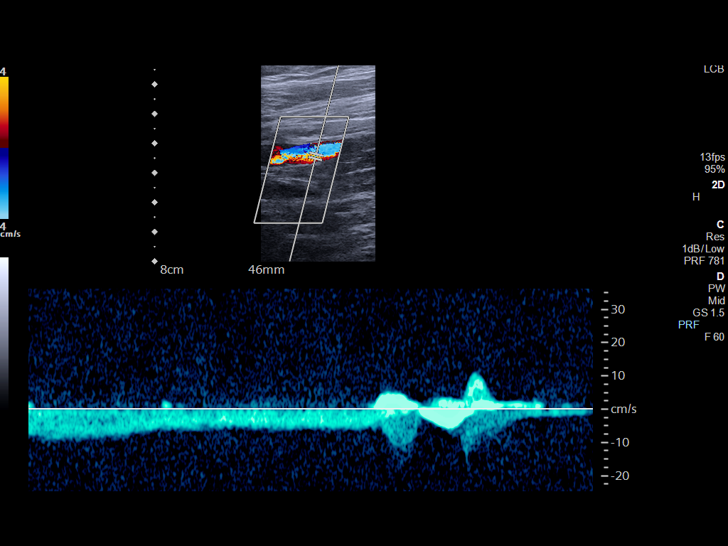
[im 18/35]
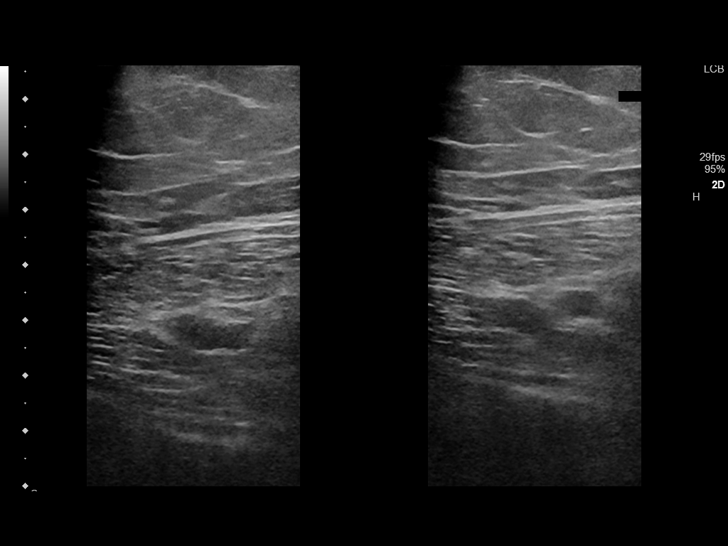
[im 21/35]
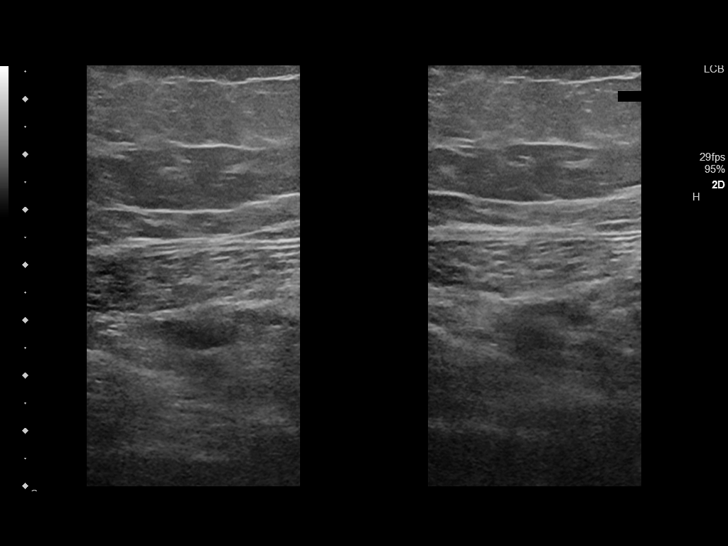
[im 24/35]
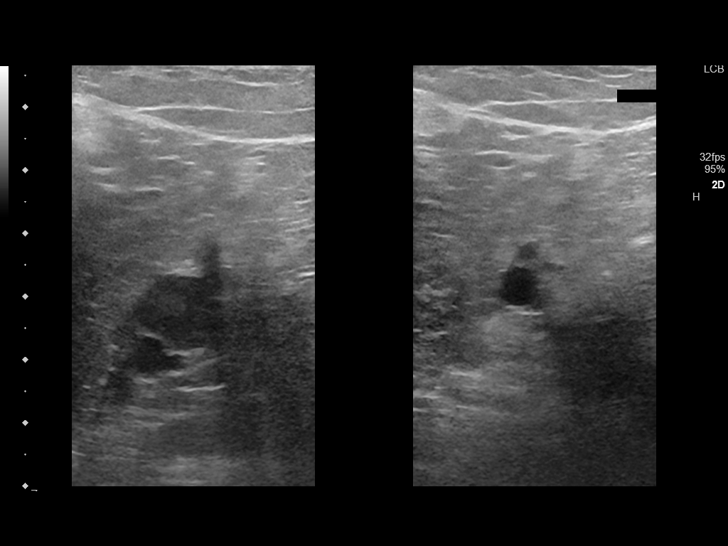
[im 27/35]
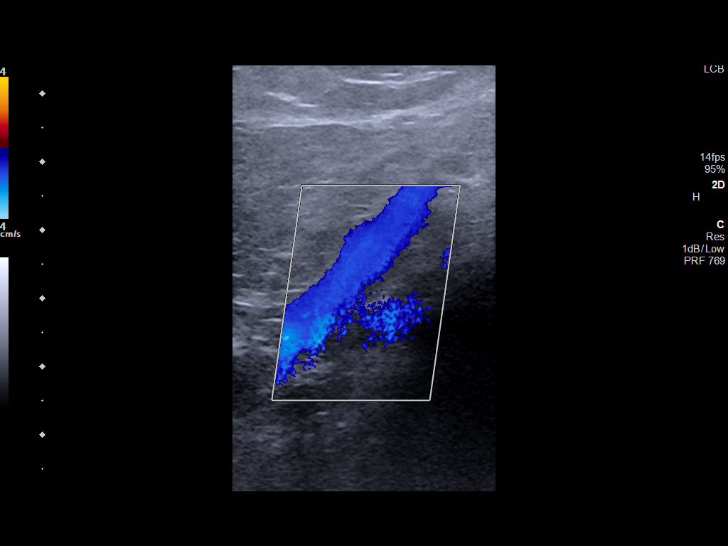
[im 29/35]
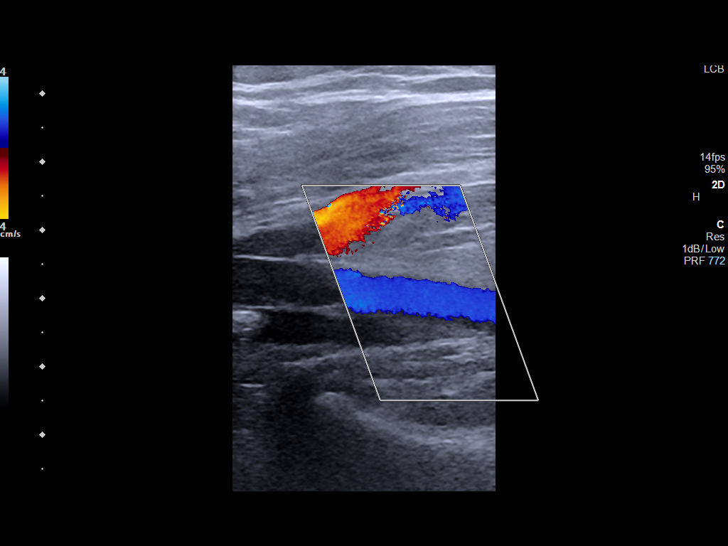
[im 32/35]
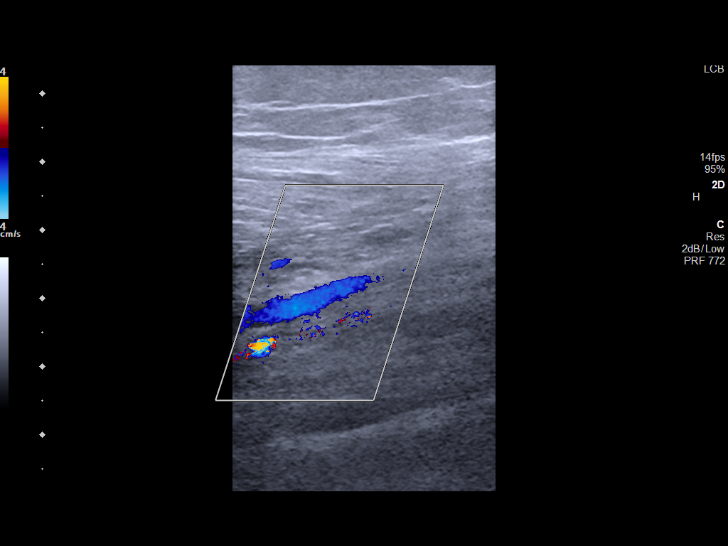
[im 35/35]
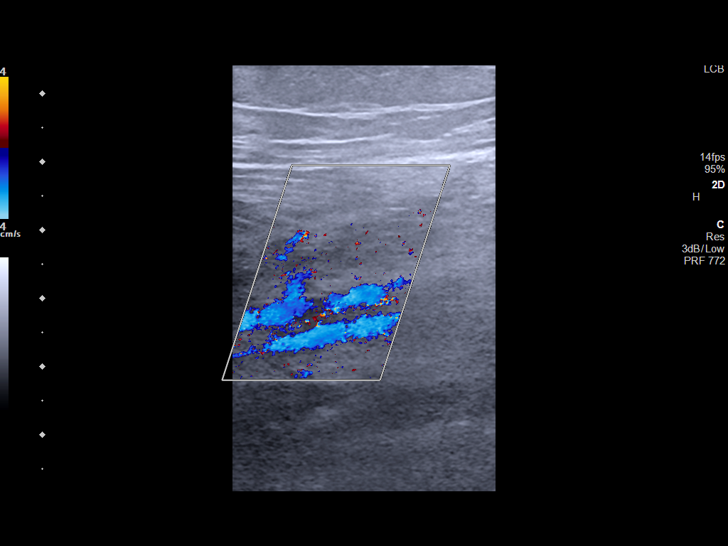

[14 of 24 positions shown; findings below may reference images not displayed]

FINDINGS: Normal compressibility of the common femoral, superficial femoral,
and popliteal veins, as well as the proximal calf veins. No filling
defects to suggest DVT on grayscale or color Doppler imaging.
Doppler waveforms show normal direction of venous flow, normal
respiratory phasicity and response to augmentation. Survey views of
the contralateral common femoral vein are unremarkable.
IMPRESSION: No femoropopliteal and no calf DVT in the visualized calf veins. If
clinical symptoms are inconsistent or if there are persistent or
worsening symptoms, further imaging (possibly involving the iliac
veins) may be warranted.

## 2021-10-25 ENCOUNTER — Telehealth: Payer: Self-pay

## 2021-10-25 NOTE — Telephone Encounter (Signed)
Dr. Myer Haff did review her records and stated that he would see her at his next available follow up appt. She will also need to bring a disc with her images on it or she can mail this to [place] as soon as possible. We will need the Cervical, Thoracic and Lumbar images that were done on 10/03/21. We have the reports and I have placed them in the folder.

## 2021-10-25 NOTE — Telephone Encounter (Signed)
Mailbox is full - unable to leave message.

## 2021-10-30 NOTE — Telephone Encounter (Signed)
Mailbox is full - unable to leave message.
